# Patient Record
Sex: Female | Born: 1945 | Race: Black or African American | Hispanic: No | Marital: Married | Smoking: Never smoker
Health system: Southern US, Community
[De-identification: ages and names within clinical notes are randomized; demographics above are authoritative.]

## PROBLEM LIST (undated history)

## (undated) DIAGNOSIS — E785 Hyperlipidemia, unspecified: Secondary | ICD-10-CM

## (undated) DIAGNOSIS — I10 Essential (primary) hypertension: Secondary | ICD-10-CM

## (undated) HISTORY — PX: TUBAL LIGATION: SHX77

## (undated) HISTORY — PX: ABDOMINAL HYSTERECTOMY: SHX81

---

## 2003-06-08 ENCOUNTER — Other Ambulatory Visit: Payer: Self-pay

## 2014-03-16 DIAGNOSIS — I10 Essential (primary) hypertension: Secondary | ICD-10-CM | POA: Insufficient documentation

## 2014-03-16 DIAGNOSIS — D129 Benign neoplasm of anus and anal canal: Secondary | ICD-10-CM | POA: Insufficient documentation

## 2014-03-16 DIAGNOSIS — D128 Benign neoplasm of rectum: Secondary | ICD-10-CM | POA: Insufficient documentation

## 2014-04-05 DIAGNOSIS — E785 Hyperlipidemia, unspecified: Secondary | ICD-10-CM | POA: Insufficient documentation

## 2014-04-05 DIAGNOSIS — D1803 Hemangioma of intra-abdominal structures: Secondary | ICD-10-CM | POA: Insufficient documentation

## 2014-04-16 DIAGNOSIS — M8580 Other specified disorders of bone density and structure, unspecified site: Secondary | ICD-10-CM | POA: Insufficient documentation

## 2015-03-14 DIAGNOSIS — E663 Overweight: Secondary | ICD-10-CM | POA: Insufficient documentation

## 2015-04-04 DIAGNOSIS — M170 Bilateral primary osteoarthritis of knee: Secondary | ICD-10-CM | POA: Insufficient documentation

## 2015-09-12 DIAGNOSIS — R7303 Prediabetes: Secondary | ICD-10-CM | POA: Insufficient documentation

## 2016-02-22 ENCOUNTER — Emergency Department: Payer: Medicare PPO

## 2016-02-22 ENCOUNTER — Emergency Department
Admission: EM | Admit: 2016-02-22 | Discharge: 2016-02-22 | Disposition: A | Discharge status: Home / Self Care | Payer: Medicare PPO | Attending: Emergency Medicine | Admitting: Emergency Medicine

## 2016-02-22 DIAGNOSIS — S01511A Laceration without foreign body of lip, initial encounter: Secondary | ICD-10-CM | POA: Diagnosis not present

## 2016-02-22 DIAGNOSIS — W01198A Fall on same level from slipping, tripping and stumbling with subsequent striking against other object, initial encounter: Secondary | ICD-10-CM | POA: Insufficient documentation

## 2016-02-22 DIAGNOSIS — Y929 Unspecified place or not applicable: Secondary | ICD-10-CM | POA: Diagnosis not present

## 2016-02-22 DIAGNOSIS — S0181XA Laceration without foreign body of other part of head, initial encounter: Secondary | ICD-10-CM | POA: Diagnosis not present

## 2016-02-22 DIAGNOSIS — S8002XA Contusion of left knee, initial encounter: Secondary | ICD-10-CM | POA: Diagnosis not present

## 2016-02-22 DIAGNOSIS — T148XXA Other injury of unspecified body region, initial encounter: Secondary | ICD-10-CM

## 2016-02-22 DIAGNOSIS — Y939 Activity, unspecified: Secondary | ICD-10-CM | POA: Insufficient documentation

## 2016-02-22 DIAGNOSIS — S0990XA Unspecified injury of head, initial encounter: Secondary | ICD-10-CM | POA: Diagnosis present

## 2016-02-22 DIAGNOSIS — I10 Essential (primary) hypertension: Secondary | ICD-10-CM | POA: Diagnosis not present

## 2016-02-22 DIAGNOSIS — Y999 Unspecified external cause status: Secondary | ICD-10-CM | POA: Diagnosis not present

## 2016-02-22 HISTORY — DX: Hyperlipidemia, unspecified: E78.5

## 2016-02-22 HISTORY — DX: Essential (primary) hypertension: I10

## 2016-02-22 MED ORDER — LIDOCAINE-EPINEPHRINE (PF) 1 %-1:200000 IJ SOLN
INTRAMUSCULAR | Status: AC
  Filled 2016-02-22: qty 30

## 2016-02-22 NOTE — ED Triage Notes (Signed)
Pt states she tripped and fell on concrete.. Pt has a laceration to the forehead with controlled bleeding at present, an abrasion to the upper lip .Marland Kitchen Denies LOC.. Per pt and family, pt is at baseline, a/ox4.Marland Kitchen Pt also c/o left knee pain.

## 2016-02-22 NOTE — ED Provider Notes (Signed)
[facility] Emergency Department Provider Note  ____________________________________________   First MD Initiated Contact with Patient 02/22/16 1622     (approximate)  I have reviewed the triage vital signs and the nursing notes.   HISTORY  Chief Complaint Fall   HPI Victoria Dodson is a 71 y.o. female with a history of hypertension and hyperlipidemia who is presenting after trip and fall. She misjudged the last step and a staircase and fell forward onto cement hitting the front of her face. She denies losing consciousness but did sustain a laceration to the middle of her forehead. She also sustained an abrasion to the right side of her philtrum as well as to the lower lip. She says that her last tetanus shot was about a year ago. Denying any severe headache at this time. Denying any neck pain.   Past Medical History:  Diagnosis Date  . Hyperlipemia   . Hypertension     There are no active problems to display for this patient.   Past Surgical History:  Procedure Laterality Date  . ABDOMINAL HYSTERECTOMY    . TUBAL LIGATION      Prior to Admission medications   Not on File    Allergies Review of patient's allergies indicates no known allergies.  No family history on file.  Social History Social History  Substance Use Topics  . Smoking status: Never Smoker  . Smokeless tobacco: Never Used  . Alcohol use No    Review of Systems Constitutional: No fever/chills Eyes: No visual changes. ENT: No sore throat. Cardiovascular: Denies chest pain. Respiratory: Denies shortness of breath. Gastrointestinal: No abdominal pain.  No nausea, no vomiting.  No diarrhea.  No constipation. Genitourinary: Negative for dysuria. Musculoskeletal: Negative for back pain. Skin: Negative for rash. Neurological: Negative for headaches, focal weakness or numbness.  10-point ROS otherwise  negative.  ____________________________________________   PHYSICAL EXAM:  VITAL SIGNS: ED Triage Vitals  Enc Vitals Group     BP 02/22/16 1332 (!) 153/86     Pulse Rate 02/22/16 1332 89     Resp 02/22/16 1332 18     Temp 02/22/16 1332 98.2 F (36.8 C)     Temp Source 02/22/16 1332 Oral     SpO2 02/22/16 1332 100 %     Weight 02/22/16 1332 170 lb (77.1 kg)     Height 02/22/16 1332 5\' 7"  (1.702 m)     Head Circumference --      Peak Flow --      Pain Score 02/22/16 1333 8     Pain Loc --      Pain Edu? --      Excl. in [place]? --     Constitutional: Alert and oriented. Well appearing and in no acute distress. Eyes: Conjunctivae are normal. PERRL. EOMI. Head: Laceration in an upside down U-shaped to the midline forehead between the eyebrows. It is through the subcutaneous tissues but does not violate the galea. There is no active bleeding. There is no pus, no induration. Mild tenderness without bogginess. Nose: No congestion/rhinnorhea. Mouth/Throat: Mucous membranes are moist.  Neck: No stridor.  No tenderness to the midline cervical spine. No deformity or step-off. Cardiovascular: Normal rate, regular rhythm. Grossly normal heart sounds.  Good peripheral circulation. Respiratory: Normal respiratory effort.  No retractions. Lungs CTAB. Gastrointestinal: Soft and nontender. No distention.  Musculoskeletal: No lower extremity tenderness nor edema.  No joint effusions. Mild tenderness to the left knee anteriorly but without any effusion. No  deformity. No step-offs. No ligamentous laxity. Patient with 5 out of 5 strength to bilateral lower external without any restriction to active range of motion. Neurologic:  Normal speech and language. No gross focal neurologic deficits are appreciated. No gait instability. Skin:  Abrasion to the right side of the filtrum that is 1 x 2 cm and superficial without any active bleeding, induration or pus. There is also a superficial abrasion with a mild  laceration component to the mucosal surface of the lower midline lip that is about a half a centimeter in length. The wound is superficial. Psychiatric: Mood and affect are normal. Speech and behavior are normal.  ____________________________________________   LABS (all labs ordered are listed, but only abnormal results are displayed)  Labs Reviewed - No data to display ____________________________________________  EKG   ____________________________________________  RADIOLOGY  CT Head Wo Contrast (Accession [id number]) (Order [id number])  Imaging  Date: 02/22/2016 Department: [place] Released By: Frederich Balding, RN (auto-released) Authorizing: Harvest Dark, MD  PACS Images   Show images for CT Head Wo Contrast  Study Result   CLINICAL DATA:  Tripped, fell down steps.  Laceration to forehead.  EXAM: CT HEAD WITHOUT CONTRAST  TECHNIQUE: Contiguous axial images were obtained from the base of the skull through the vertex without intravenous contrast.  COMPARISON:  None.  FINDINGS: Brain: No acute intracranial abnormality. Specifically, no hemorrhage, hydrocephalus, mass lesion, acute infarction, or significant intracranial injury.  Vascular: No hyperdense vessel or unexpected calcification.  Skull: No acute calvarial abnormality.  Sinuses/Orbits: Visualized paranasal sinuses and mastoids clear. Orbital soft tissues unremarkable.  Other: None  IMPRESSION: No acute intracranial abnormality.   Electronically Signed   By: Rolm Baptise M.D.   On: 02/22/2016 14:24     ____________________________________________   PROCEDURES  Procedure(s) performed:   Procedures  Critical Care performed:   LACERATION REPAIR Performed by: Doran Stabler Authorized by: Doran Stabler Consent: Verbal consent obtained. Risks and benefits: risks, benefits and alternatives were discussed Consent given  by: patient Patient identity confirmed: provided demographic data Prepped and Draped in normal sterile fashion Wound explored  Laceration Location: Midline forehead between the eyebrows  Laceration Length: 4 cm  No Foreign Bodies seen or palpated  Anesthesia: local infiltration  Local anesthetic: lidocaine 1% with epinephrine  Anesthetic total: 3 ml  Irrigation method: syringe Amount of cleaning: standard  Skin closure: 5-0 nylon   Number of sutures: 2  Technique: Simple interrupted   Patient tolerance: Patient tolerated the procedure well with no immediate complications. Applied tension with the 2 sutures and then used Dermabond to close the rest of the wound. Good cosmesis was achieved and there was no active bleeding. Steri-Strips were applied over the laceration for extra support. ____________________________________________   INITIAL IMPRESSION / ASSESSMENT AND PLAN / ED COURSE  Pertinent labs & imaging results that were available during my care of the patient were reviewed by me and considered in my medical decision making (see chart for details).    Clinical Course  I discussed with the patient as well as her daughter who is the bedside to keep the wound dry over the first 24 hours and that she should report to her primary care doctor to have the wound inspected as well as the sutures removed in 5 days. She is understanding the plan and willing to comply.   ____________________________________________   FINAL CLINICAL IMPRESSION(S) / ED DIAGNOSES  Fall. Abrasion. Laceration.  NEW MEDICATIONS STARTED DURING THIS VISIT:  New Prescriptions   No medications on file     Note:  This document was prepared using Dragon voice recognition software and may include unintentional dictation errors.    Orbie Pyo, MD 02/22/16 [phone]

## 2017-04-08 IMAGING — CT CT HEAD W/O CM
3 series · 16 of 46 positions shown, 19 images · non-contrast
Comparison: None.

CLINICAL DATA: Tripped, fell down steps.  Laceration to forehead.

EXAM:
CT HEAD WITHOUT CONTRAST
TECHNIQUE: Contiguous axial images were obtained from the base of the skull
through the vertex without intravenous contrast.

[Series 2: head wo · axial · 0.39mm/px · z∈[-92,+28]mm · 10 of 29 slices shown, 13 images]
[im 3/29  brain]
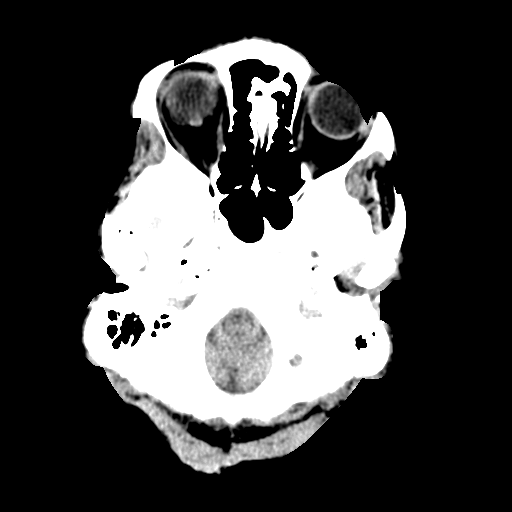
[im 3/29  bone]
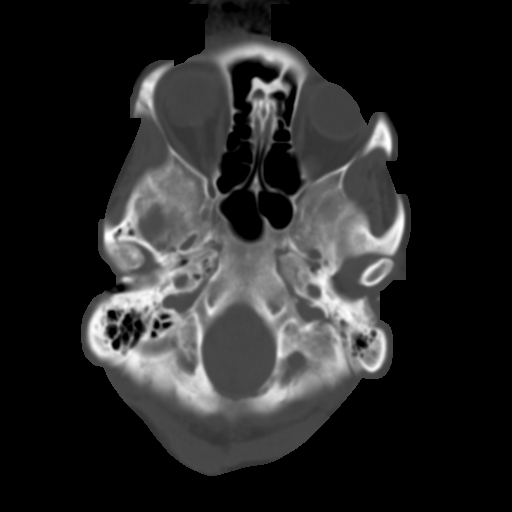
[im 6/29  brain]
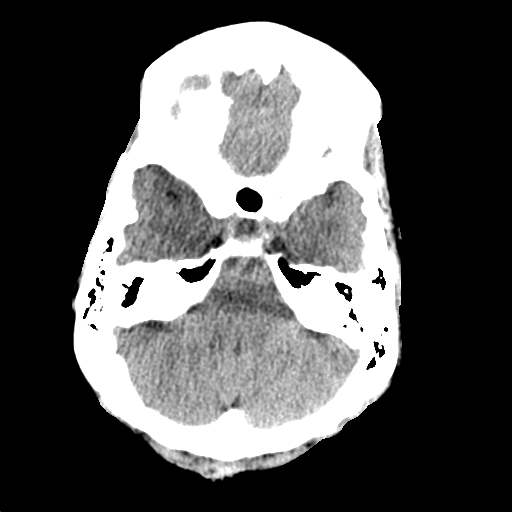
[im 8/29  brain]
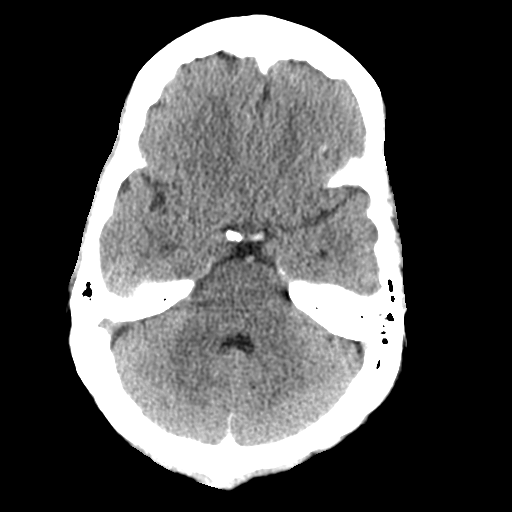
[im 11/29  brain]
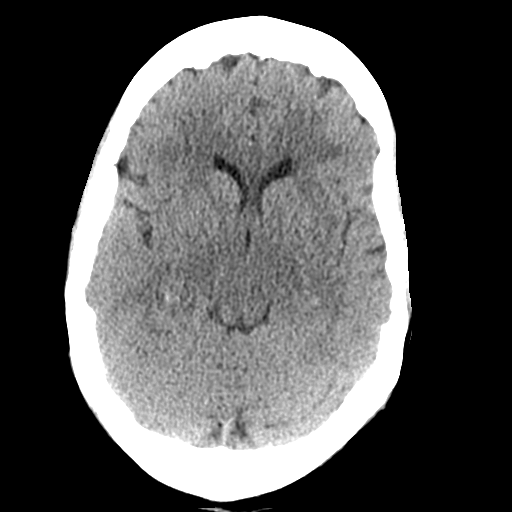
[im 14/29  brain]
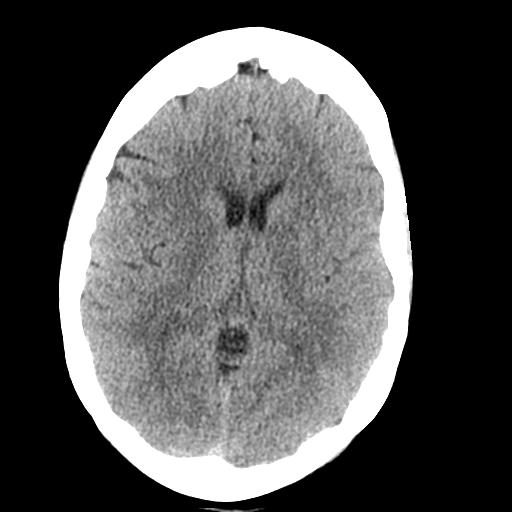
[im 14/29  bone]
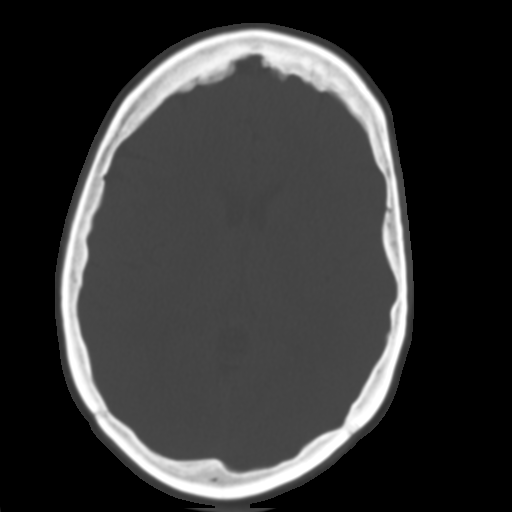
[im 16/29  brain]
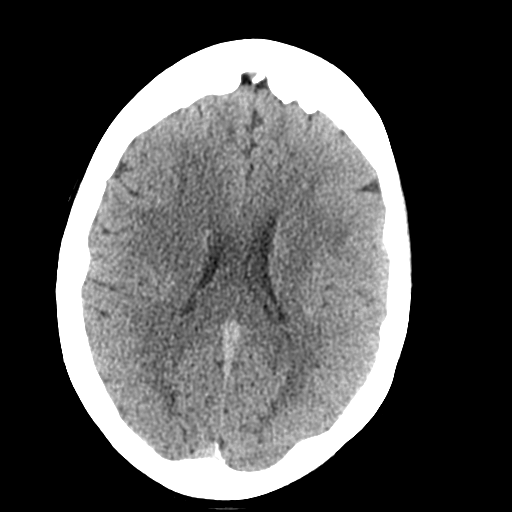
[im 19/29  brain]
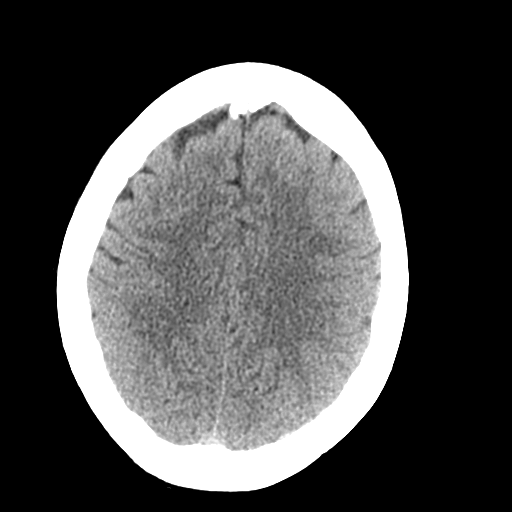
[im 22/29  brain]
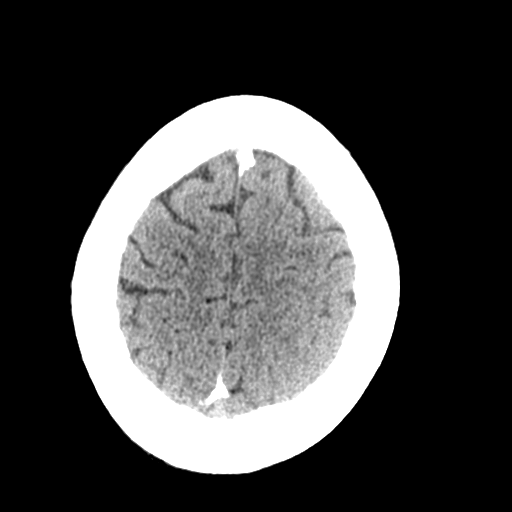
[im 24/29  brain]
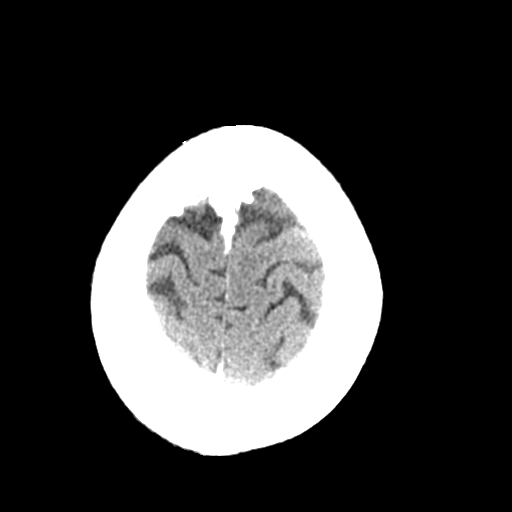
[im 24/29  bone]
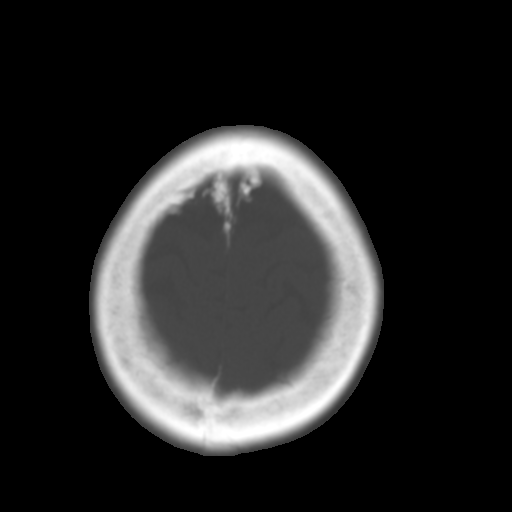
[im 27/29  brain]
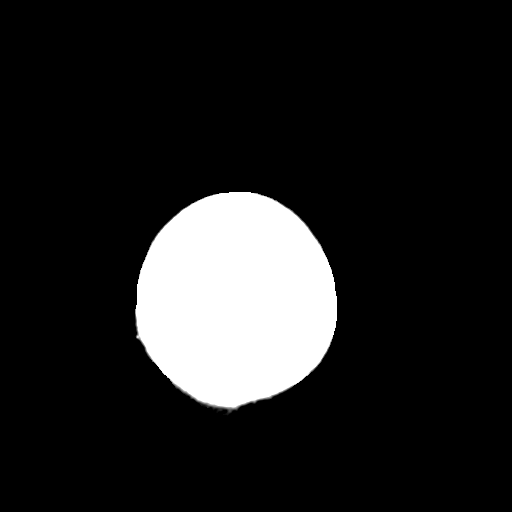

[Series 4: coronal soft tissue · coronal · 0.29mm/px · 3 of 66 slices shown]
[im 22/66  brain]
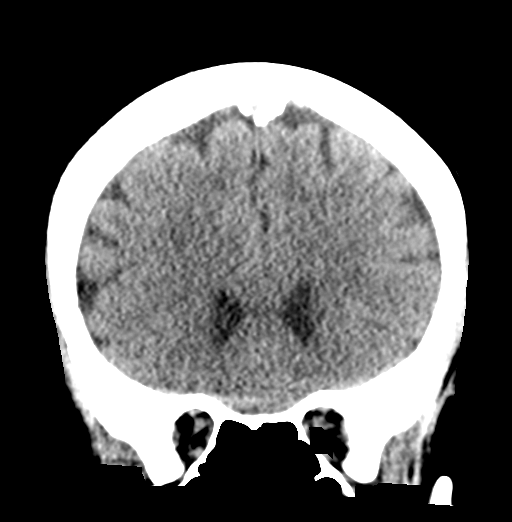
[im 29/66  brain]
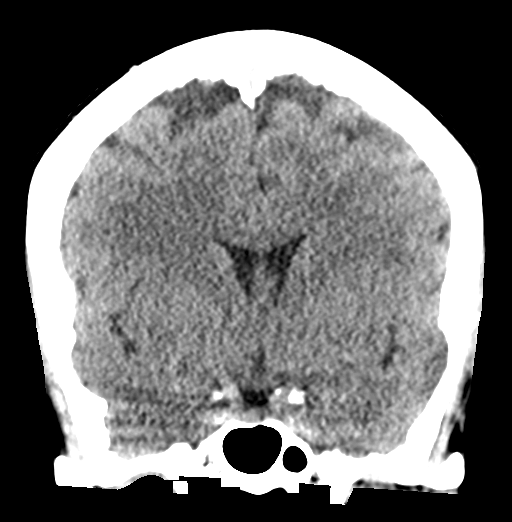
[im 37/66  brain]
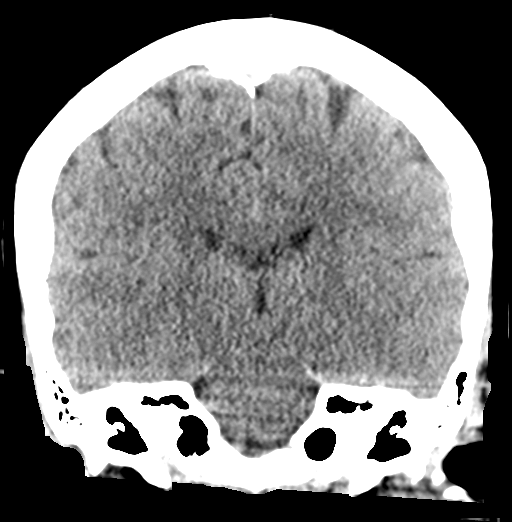

[Series 5: sagittal soft tissue · sagittal · 0.29mm/px · 3 of 50 slices shown]
[im 17/50  brain]
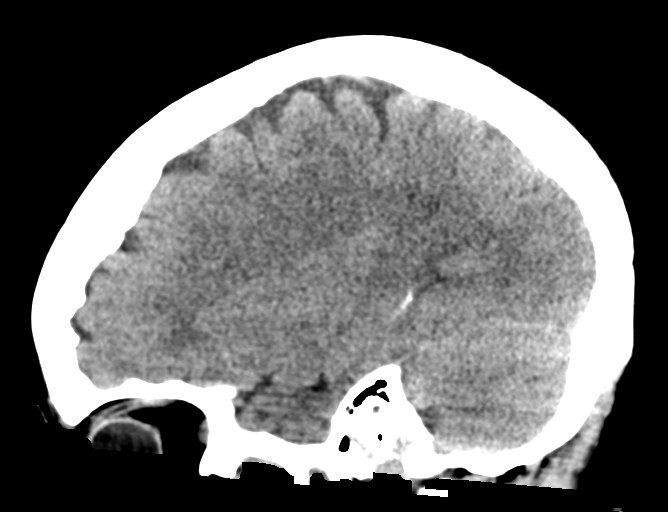
[im 25/50  brain]
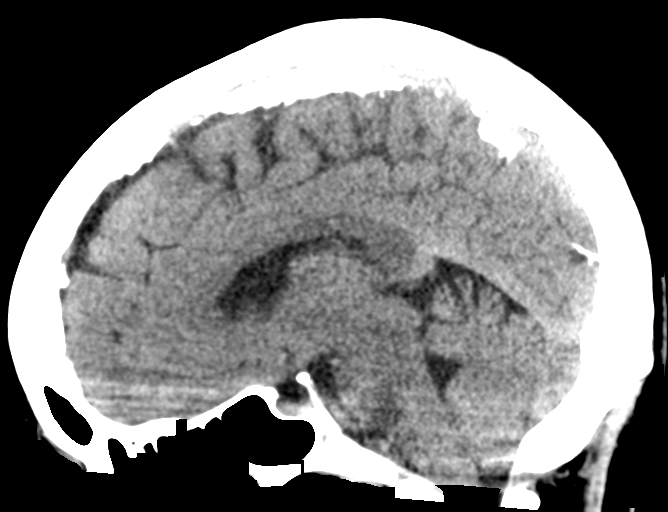
[im 33/50  brain]
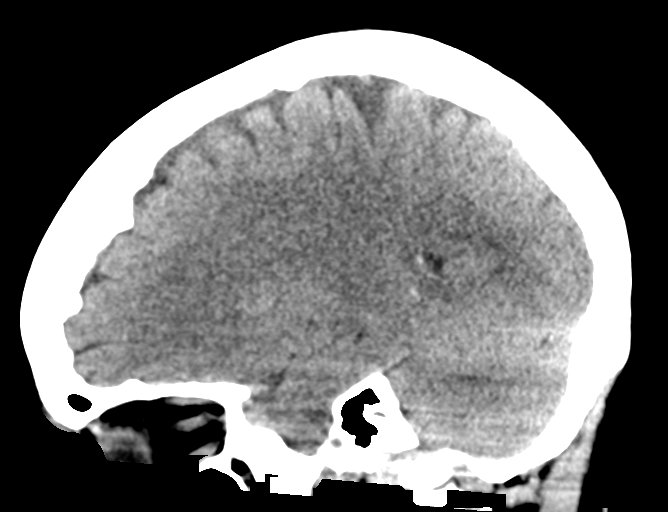

[16 of 46 positions shown; findings below may reference images not displayed]

FINDINGS: Brain: No acute intracranial abnormality. Specifically, no
hemorrhage, hydrocephalus, mass lesion, acute infarction, or
significant intracranial injury.

Vascular: No hyperdense vessel or unexpected calcification.

Skull: No acute calvarial abnormality.

Sinuses/Orbits: Visualized paranasal sinuses and mastoids clear.
Orbital soft tissues unremarkable.

Other: None
IMPRESSION: No acute intracranial abnormality.

## 2020-08-11 DIAGNOSIS — M1712 Unilateral primary osteoarthritis, left knee: Secondary | ICD-10-CM | POA: Insufficient documentation

## 2021-07-12 ENCOUNTER — Encounter: Payer: Self-pay | Admitting: Sports Medicine

## 2021-07-12 ENCOUNTER — Other Ambulatory Visit: Payer: Self-pay

## 2021-07-12 ENCOUNTER — Ambulatory Visit (INDEPENDENT_AMBULATORY_CARE_PROVIDER_SITE_OTHER): Payer: Medicare HMO | Admitting: Sports Medicine

## 2021-07-12 DIAGNOSIS — B351 Tinea unguium: Secondary | ICD-10-CM | POA: Diagnosis not present

## 2021-07-12 DIAGNOSIS — M79674 Pain in right toe(s): Secondary | ICD-10-CM

## 2021-07-12 NOTE — Progress Notes (Signed)
Subjective: Victoria Dodson is a 76 y.o. female patient seen today in office with complaint of thickened and painful right hallux nail states that is been going on for about 2 months gets sore and tender and she soaks denies any known trauma or injury to this toenail but is concerned for fungus.  Patient denies any other pedal complaints at this time.  Patient admits that she may be scheduled for knee surgery in the upcoming months.  There are no problems to display for this patient.   Current Outpatient Medications on File Prior to Visit  Medication Sig Dispense Refill   lisinopril (ZESTRIL) 20 MG tablet Take 1 tablet by mouth daily.     rosuvastatin (CRESTOR) 20 MG tablet Take 1 tablet by mouth daily.     senna-docusate (SENOKOT-S) 8.6-50 MG tablet Take by mouth.     fluticasone (FLONASE) 50 MCG/ACT nasal spray 2 sprays by Each Nare route daily.     Omega-3 Fatty Acids (FISH OIL) 1000 MG CAPS Take by mouth.     No current facility-administered medications on file prior to visit.    No Known Allergies  Objective: Physical Exam  General: Well developed, nourished, no acute distress, awake, alert and oriented x 3  Vascular: Dorsalis pedis artery 2/4 bilateral, Posterior tibial artery 1/4 bilateral, skin temperature warm to warm proximal to distal bilateral lower extremities, no varicosities, pedal hair present bilateral.  Neurological: Gross sensation present via light touch bilateral.   Dermatological: Skin is warm, dry, and supple bilateral, right hallux nail mildly elongated, thick and discolored with moderate subungal debris, partial distal lifting, no webspace macerations present bilateral, no open lesions present bilateral, no callus/corns/hyperkeratotic tissue present bilateral. No signs of infection bilateral.  Musculoskeletal: Bunion boney deformities noted bilateral right greater than left. Muscular strength within normal limits without painon range of motion. No pain with  calf compression bilateral.  Assessment and Plan:  Problem List Items Addressed This Visit   None Visit Diagnoses     Dermatophytosis of nail    -  Primary   Relevant Orders   Fungus Culture with Smear   Toe pain, right           -Examined patient -Discussed treatment options for painful right hallux nail -Fungal culture was obtained by removing a portion of the hard nail itself from right hallux nail using a sterile nail nipper and sent to [facility] lab. Patient tolerated the biopsy procedure well without discomfort or need for anesthesia.  -Recommend foot miracle cream for dry skin as needed to heels. -Patient to return in 4 weeks for follow up evaluation and discussion of fungal culture results or sooner if symptoms worsen.  Landis Martins, DPM

## 2021-08-09 ENCOUNTER — Other Ambulatory Visit: Payer: Self-pay

## 2021-08-09 ENCOUNTER — Ambulatory Visit (INDEPENDENT_AMBULATORY_CARE_PROVIDER_SITE_OTHER): Payer: Medicare HMO | Admitting: Sports Medicine

## 2021-08-09 ENCOUNTER — Encounter: Payer: Self-pay | Admitting: Sports Medicine

## 2021-08-09 DIAGNOSIS — Z79899 Other long term (current) drug therapy: Secondary | ICD-10-CM | POA: Diagnosis not present

## 2021-08-09 DIAGNOSIS — M79674 Pain in right toe(s): Secondary | ICD-10-CM

## 2021-08-09 DIAGNOSIS — B372 Candidiasis of skin and nail: Secondary | ICD-10-CM | POA: Diagnosis not present

## 2021-08-09 NOTE — Progress Notes (Signed)
Subjective: ?Victoria Dodson is a 76 y.o. female patient seen today in office for fungal culture results. Patient has no other pedal complaints at this time.  ? ?Patient Active Problem List  ? Diagnosis Date Noted  ? Primary osteoarthritis of left knee 08/11/2020  ? Pre-diabetes 09/12/2015  ? Primary osteoarthritis of both knees 04/04/2015  ? Over weight 03/14/2015  ? Osteopenia 04/16/2014  ? Hyperlipidemia 04/05/2014  ? Liver hemangioma 04/05/2014  ? 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 Primary hypertension 03/16/2014  ? ? ?Current Outpatient Medications on File Prior to Visit  ?Medication Sig Dispense Refill  ? fluticasone (FLONASE) 50 MCG/ACT nasal spray 2 sprays by Each Nare route daily.    ? lisinopril (ZESTRIL) 20 MG tablet Take 1 tablet by mouth daily.    ? Omega-3 Fatty Acids (FISH OIL) 1000 MG CAPS Take by mouth.    ? rosuvastatin (CRESTOR) 20 MG tablet Take 1 tablet by mouth daily.    ? senna-docusate (SENOKOT-S) 8.6-50 MG tablet Take by mouth.    ? ?No current facility-administered medications on file prior to visit.  ? ? ?No Known Allergies ? ?Objective: ?Physical Exam ? ?General: Well developed, nourished, no acute distress, awake, alert and oriented x 3 ? ?Vascular: Dorsalis pedis artery 2/4 bilateral, Posterior tibial artery 1/4 bilateral, skin temperature warm to warm proximal to distal bilateral lower extremities, no varicosities, pedal hair present bilateral. ?  ?Neurological: Gross sensation present via light touch bilateral.  ?  ?Dermatological: Skin is warm, dry, and supple bilateral, right hallux nail mildly elongated, thick and discolored with moderate subungal debris, partial distal lifting, no webspace macerations present bilateral, no open lesions present bilateral, no callus/corns/hyperkeratotic tissue present bilateral. No signs of infection bilateral. ?  ?Musculoskeletal: Bunion boney deformities noted bilateral right greater than left. Muscular strength within  normal limits without painon range of motion. No pain with calf compression bilateral. ? ? ?Fungal culture + yeast with splitting of nail ? ?Assessment and Plan:  ?Problem List Items Addressed This Visit   ?None ?Visit Diagnoses   ? ? Long term current use of therapeutic drug    -  Primary  ? Relevant Orders  ? Hepatic Function Panel  ? Candida onychomycosis      ? Toe pain, right      ? ?  ? ? ?-Examined patient ?-Discussed treatment options for painful mycotic nails secondary to yeast and nail splitting ?-Patient opt for oral itraconazole with full understanding of medication risks; ordered LFTs for review if within normal limits will proceed with sending Rx to pharmacy if blood work is normal, anticipate 12 week course.  ?-Advised also for patient to use topical Eucerin cream to the toenail after bath or shower as well and has to take over-the-counter biotin supplements ?-Patient to return in approximately 6 weeks assuming that she will be starting the itraconazole sooner problems or issues arise ? ?Landis Martins, DPM ? ?

## 2021-08-10 ENCOUNTER — Other Ambulatory Visit: Payer: Self-pay | Admitting: Sports Medicine

## 2021-08-10 ENCOUNTER — Telehealth: Payer: Self-pay | Admitting: Sports Medicine

## 2021-08-10 DIAGNOSIS — B372 Candidiasis of skin and nail: Secondary | ICD-10-CM

## 2021-08-10 LAB — HEPATIC FUNCTION PANEL
ALT: 10 IU/L (ref 0–32)
AST: 17 IU/L (ref 0–40)
Albumin: 3.8 g/dL (ref 3.7–4.7)
Alkaline Phosphatase: 126 IU/L — ABNORMAL HIGH (ref 44–121)
Bilirubin Total: 0.3 mg/dL (ref 0.0–1.2)
Bilirubin, Direct: 0.11 mg/dL (ref 0.00–0.40)
Total Protein: 6.1 g/dL (ref 6.0–8.5)

## 2021-08-10 MED ORDER — FLUCONAZOLE 150 MG PO TABS: ORAL_TABLET | ORAL | 0 refills | Status: AC

## 2021-08-10 MED ORDER — ITRACONAZOLE 100 MG PO CAPS: 100.0000 mg | ORAL_CAPSULE | Freq: Every day | ORAL | 0 refills | Status: AC

## 2021-08-10 NOTE — Telephone Encounter (Signed)
Pt called stating her insurance doesn't cover her medication and she is unable to purchase it at this time. She would like to know if there's an alternative medication or an OTC instead. Please advise.  ?

## 2021-08-10 NOTE — Progress Notes (Signed)
Changed Itraconazole to Diflucan sine her pharmacy does not cover it ?-Dr. Cannon Kettle ?

## 2021-08-10 NOTE — Progress Notes (Signed)
Sent itraconazole to pharmacy, LFTs near normal ok to start ?-Dr. Cannon Kettle ?

## 2021-08-21 ENCOUNTER — Telehealth: Payer: Self-pay | Admitting: *Deleted

## 2021-08-21 NOTE — Telephone Encounter (Signed)
Called to get status on the prior authorization from insurance company and the patient's plan does not cover prescriptions, spoke with Kirke Corin. ?Called patient and she was aware that she did not have that plan, said that she normally gets her medicine from [place]. She called and they will not cover that particular medicine(Fluconazole). ? She did not like what she read about the side effects of the medicine and would prefer something else. Will continue to soak in epsom salts and using eucerin cream until further instructions. ?

## 2021-09-20 ENCOUNTER — Ambulatory Visit: Payer: Medicare HMO | Admitting: Sports Medicine

## 2021-10-04 ENCOUNTER — Ambulatory Visit: Payer: Medicare HMO | Admitting: Sports Medicine

## 2022-04-16 ENCOUNTER — Ambulatory Visit (INDEPENDENT_AMBULATORY_CARE_PROVIDER_SITE_OTHER): Payer: Medicare HMO | Admitting: Podiatry

## 2022-04-16 DIAGNOSIS — L600 Ingrowing nail: Secondary | ICD-10-CM

## 2022-04-16 DIAGNOSIS — B351 Tinea unguium: Secondary | ICD-10-CM

## 2022-04-16 NOTE — Patient Instructions (Signed)

## 2022-04-16 NOTE — Progress Notes (Signed)
  Subjective:  Patient ID: Victoria Dodson, female    DOB: 1945-12-04,  MRN: [id number]  Chief Complaint  Patient presents with   Nail Problem    Right GREAT TOE NAIL - TURNING COLORS, PAINFUL. Patient was taking Lamisil for nail fungus. Patient has not used topical medication. Patient is not diabetic.     76 y.o. female presents with concern for pain in the right hallux.  She also notices the nail is thickened discolored and growing abnormally.  She has not yet tried any topical medications or taken any oral antifungals.  Patient does not have a history of diabetes.  She denies any history of trauma to this nail.  Past Medical History:  Diagnosis Date   Hyperlipemia    Hypertension     No Known Allergies  ROS: Negative except as per HPI above  Objective:  General: AAO x3, NAD  Dermatological: R hallux nail extremely thickened, black discoloration, dystrophic, pain with palpation total nail.   Vascular:  Dorsalis Pedis artery and Posterior Tibial artery pedal pulses are 2/4 bilateral.  Capillary fill time < 3 sec to all digits.   Neruologic: Grossly intact via light touch bilateral. Protective threshold intact to all sites bilateral.   Musculoskeletal: No gross boney pedal deformities bilateral. No pain, crepitus, or limitation noted with foot and ankle range of motion bilateral. Muscular strength 5/5 in all groups tested bilateral.  Gait: Unassisted, Nonantalgic.   No images are attached to the encounter.   Assessment:   1. Ingrown nail of great toe of right foot   2. Onychomycosis      Plan:  Patient was evaluated and treated and all questions answered.    Ingrown Nail, right hallux due to fungal thickening -Patient elects to proceed with minor surgery to remove ingrown toenail today. Consent reviewed and signed by patient. -Ingrown nail excised. See procedure note. -Educated on post-procedure care including soaking. Written instructions provided and  reviewed. -Patient to follow up in 2 weeks for nail check.  Procedure: Right hallux total avulsion Location: Right 1st toe total nail Anesthesia: Lidocaine 1% plain; 1.5 mL and Marcaine 0.5% plain; 1.5 mL, digital block. Skin Prep: Betadine. Dressing: Silvadene; telfa; dry, sterile, compression dressing. Technique: Following skin prep, the toe was exsanguinated and a tourniquet was secured at the base of the toe. The affected nail border was freed, split with a nail splitter, and excised. Area was irrigated out with alcohol. The tourniquet was then removed and sterile dressing applied. Disposition: Patient tolerated procedure well. Patient to return in 2 weeks for follow-up.    Return in about 2 weeks (around 04/30/2022) for Follow up R hallux total avulsion.          Everitt Amber, DPM [facility]

## 2022-05-07 ENCOUNTER — Ambulatory Visit (INDEPENDENT_AMBULATORY_CARE_PROVIDER_SITE_OTHER): Payer: Medicare HMO | Admitting: Podiatry

## 2022-05-07 DIAGNOSIS — L600 Ingrowing nail: Secondary | ICD-10-CM | POA: Diagnosis not present

## 2022-05-07 DIAGNOSIS — B351 Tinea unguium: Secondary | ICD-10-CM | POA: Diagnosis not present

## 2022-05-07 MED ORDER — CICLOPIROX 8 % EX SOLN: Freq: Every day | CUTANEOUS | 0 refills | Status: DC

## 2022-05-07 NOTE — Progress Notes (Signed)
Subjective: Victoria Dodson is a 76 y.o.  female returns to office today for follow up evaluation after having right Hallux total nail avulsion for ingrown removal 2/2 mycotic thickening approximately 2 weeks ago. Patient has been soaking using epsom salts and applying topical antibiotic covered with bandaid daily. Patient denies fevers, chills, nausea, vomiting. Denies any calf pain, chest pain, SOB.   Objective:  Vitals: Reviewed  General: Well developed, nourished, in no acute distress, alert and oriented x3   Dermatology: Skin is warm, dry and supple bilateral. right hallux nail bed appears to be clean, dry, with mild granular tissue and surrounding scab. There is no surrounding erythema, edema, drainage/purulence. The remaining nails appear unremarkable at this time. There are no other lesions or other signs of infection present.  Neurovascular status: Intact. No lower extremity swelling; No pain with calf compression bilateral.  Musculoskeletal: Decreased tenderness to palpation of the right hallux nail bed. Muscular strength within normal limits bilateral.   Assesement and Plan: S/p total right hallux avulsion to the  right hallux nail , doing well.   -Continue soaking in epsom salts twice a day followed by antibiotic ointment and a band-aid. Can leave uncovered at night. Continue this until completely healed.  -If the area has not healed in 2 weeks, call the office for follow-up appointment, or sooner if any problems arise.  -Monitor for any signs/symptoms of infection. Call the office immediately if any occur or go directly to the emergency room. Call with any questions/concerns. -Recommend use of Penlac 8% solution topically applied to the right hallux nail as it begins to grow back in in 1 to 2 months.  Apply daily at night.  This will hopefully help it from going back with significant mycotic thickening.        Everitt Amber, DPM [facility]                    05/07/2022

## 2022-05-29 ENCOUNTER — Other Ambulatory Visit: Payer: Self-pay

## 2022-05-29 DIAGNOSIS — B351 Tinea unguium: Secondary | ICD-10-CM

## 2022-05-29 MED ORDER — CICLOPIROX 8 % EX SOLN: Freq: Every day | CUTANEOUS | 0 refills | Status: DC

## 2023-02-18 ENCOUNTER — Ambulatory Visit (INDEPENDENT_AMBULATORY_CARE_PROVIDER_SITE_OTHER): Payer: Medicare Other | Admitting: Podiatry

## 2023-02-18 DIAGNOSIS — B351 Tinea unguium: Secondary | ICD-10-CM

## 2023-02-18 NOTE — Progress Notes (Signed)
  Subjective:  Patient ID: Victoria Dodson, female    DOB: 22-Sep-1945,  MRN: [id number]  Chief Complaint  Patient presents with   Nail Problem    Nail fungus to right hallux. Patient is not using the Penlac as ordered. She does have the medication at home. Right hallux nail was removed 04/16/2022.     77 y.o. female presents for follow-up on right hallux nail fungal infection.  Previously had the right hallux nail removed in April 16, 2022.  Nail has been growing back as previously planned.  She was using some of the Penlac but not doing it on a daily basis.  Denies any pain in the right hallux nail dystroph  Past Medical History:  Diagnosis Date   Hyperlipemia    Hypertension     No Known Allergies  ROS: Negative except as per HPI above  Objective:  General: AAO x3, NAD  Dermatological: Dystrophy noted of the right hallux nail with longitudinal ridging mild whitish discoloration but no significant thickening or pain on palpation  Vascular:  Dorsalis Pedis artery and Posterior Tibial artery pedal pulses are 2/4 bilateral.  Capillary fill time < 3 sec to all digits.   Neruologic: Grossly intact via light touch bilateral. Protective threshold intact to all sites bilateral.   Musculoskeletal: No gross boney pedal deformities bilateral. No pain, crepitus, or limitation noted with foot and ankle range of motion bilateral. Muscular strength 5/5 in all groups tested bilateral.  Gait: Unassisted, Nonantalgic.   No images are attached to the encounter.  Assessment:   1. Onychomycosis   2. Dermatophytosis of nail      Plan:  Patient was evaluated and treated and all questions answered.  # Mild nail fungal infection onychomycosis of the right hallux nail improved after prior total nail avulsion -Recommend continued use of Penlac topical solution apply once daily to the right hallux nail for the next 3 months -Overall the nail is improved from prior and does not appear as thick  or dystrophic as it was before the prior removal -Will continue to monitor with topical antifungal therapy  Return in about 3 months (around 05/20/2023) for f/u R hallux onychomycosis.          Corinna Gab, DPM [facility]

## 2023-05-20 ENCOUNTER — Ambulatory Visit: Payer: Medicare Other | Admitting: Podiatry

## 2023-06-25 ENCOUNTER — Encounter: Payer: Self-pay | Admitting: Podiatry

## 2023-06-25 ENCOUNTER — Ambulatory Visit (INDEPENDENT_AMBULATORY_CARE_PROVIDER_SITE_OTHER): Payer: Medicare Other | Admitting: Podiatry

## 2023-06-25 DIAGNOSIS — B351 Tinea unguium: Secondary | ICD-10-CM

## 2023-06-25 MED ORDER — CICLOPIROX 8 % EX SOLN: Freq: Every day | CUTANEOUS | 3 refills | Status: AC

## 2023-06-25 NOTE — Progress Notes (Signed)
  Subjective:  Patient ID: Victoria Dodson, female    DOB: 15-Dec-1945,  MRN: [id number]  Chief Complaint  Patient presents with   Nail Problem    Rt great nail, fungus, pt states she thinks she should just take it off.     78 y.o. female presents for follow-up on right hallux nail fungal infection.  Previously had the right hallux nail removed in April 16, 2022.  She has been continuing with the penlac . She is unsure of her progress and would like to discuss treatment options.  Past Medical History:  Diagnosis Date   Hyperlipemia    Hypertension     No Known Allergies  ROS: Negative except as per HPI above  Objective:  General: AAO x3, NAD  Dermatological: Dystrophy noted of the right hallux nail with longitudinal ridging mild whitish discoloration some thickening to distal aspect.  There does appear to be proximal clear nail growth approximately the proximal one third with similar pigmentation to the unaffected nails.  Vascular:  Dorsalis Pedis artery and Posterior Tibial artery pedal pulses are 2/4 bilateral.  Capillary fill time < 3 sec to all digits.   Neruologic: Grossly intact via light touch bilateral. Protective threshold intact to all sites bilateral.   Musculoskeletal: No gross boney pedal deformities bilateral. No pain, crepitus, or limitation noted with foot and ankle range of motion bilateral. Muscular strength 5/5 in all groups tested bilateral.  Gait: Unassisted, Nonantalgic.   No images are attached to the encounter.  Assessment:   1. Onychomycosis   2. Dermatophytosis of nail      Plan:  Patient was evaluated and treated and all questions answered.  # Mild nail fungal infection onychomycosis of the right hallux nail, some improvement with 5 months of penlac  -Previously had total nail avulsion. -Recommend continued use of Penlac  topical solution apply once daily to the right hallux nail for the next 3 months -Overall the nail is improved from prior  and does not appear as thick or dystrophic as previously and there is evidence of clear proximal nail growth -Did discuss that we could try repeat nail avulsion with shorter dose of oral antifungal medication but that there be no guarantee of normal appearance of nail with regrowth.  Patient understands this -Will continue to monitor with topical antifungal therapy for now  Return in about 3 months (around 09/23/2023) for Onychomycosis.          Ethan Saddler, DPM [facility] / [facility]

## 2023-07-04 ENCOUNTER — Telehealth: Payer: Self-pay

## 2023-07-04 NOTE — Telephone Encounter (Signed)
Patient called  and left a message - she has a lump or bump on the bottom of her foot - Did you notice anything at her visit? She is asking if there is something OTC she can use?

## 2023-07-09 ENCOUNTER — Telehealth: Payer: Self-pay | Admitting: Podiatry

## 2023-07-09 NOTE — Telephone Encounter (Signed)
Patient called back complaining about the knot on the bottom of her foot. Would like to know what she should do

## 2023-07-16 ENCOUNTER — Ambulatory Visit (INDEPENDENT_AMBULATORY_CARE_PROVIDER_SITE_OTHER): Payer: Medicare Other

## 2023-07-16 ENCOUNTER — Ambulatory Visit (INDEPENDENT_AMBULATORY_CARE_PROVIDER_SITE_OTHER): Payer: Medicare Other | Admitting: Podiatry

## 2023-07-16 DIAGNOSIS — M79671 Pain in right foot: Secondary | ICD-10-CM | POA: Diagnosis not present

## 2023-07-16 DIAGNOSIS — L84 Corns and callosities: Secondary | ICD-10-CM

## 2023-07-16 DIAGNOSIS — M778 Other enthesopathies, not elsewhere classified: Secondary | ICD-10-CM

## 2023-07-16 NOTE — Progress Notes (Signed)
       Chief Complaint  Patient presents with   Foot Pain    Right Heel pain, Pain is in the caneter if the heel. Apears to be a callous. Xrays are up. This started 06/26/23 the day after she was here last. She has been soaking daily in epsom salts which does not seem to be helping.  Not diabetic and no anticoags.     HPI: 78 y.o. female presents today with complaint of pain to right heel.  States that she noticed a new hard spot after her last visit several weeks ago.  She does not have history of calluses that she is aware of.  States that she is prediabetic.  Past Medical History:  Diagnosis Date   Hyperlipemia    Hypertension     Past Surgical History:  Procedure Laterality Date   ABDOMINAL HYSTERECTOMY     TUBAL LIGATION      No Known Allergies  ROS    Physical Exam: There were no vitals filed for this visit.  General: The patient is alert and oriented x3 in no acute distress.  Dermatology: Hyperkeratotic lesion present plantar heel central aspect.  No surrounding erythema.  No underlying ulcer.  No evidence of foreign body.  Vascular: Palpable pedal pulses bilaterally. Capillary refill within normal limits.  No appreciable edema.  No erythema or calor.  Neurological: Light touch sensation grossly intact bilateral feet.   Musculoskeletal Exam: No gross boney pedal deformities bilateral. No pain, crepitus, or limitation noted with foot and ankle range of motion bilateral. Muscular strength 5/5 in all groups tested bilateral.   Radiographic Exam: Right foot 07/16/2023 Normal osseous mineralization. Joint spaces preserved.  No fractures or osseous irregularities noted.  Assessment/Plan of Care: 1. Right foot pain   2. Callus of heel      No orders of the defined types were placed in this encounter.  None  Discussed clinical findings with patient today.  Radiographs reviewed with the patient.  # Painful callus of right heel All symptomatic hyperkeratoses x1 were  safely debrided as a courtesy today with a sterile #312 blade to patient's level of comfort without incident. We discussed preventative and palliative care of these lesions including supportive and accommodative shoegear, padding, prefabricated and custom molded accommodative orthoses, use of a pumice stone and lotions/creams daily.  Follow-up as needed for this issue.  Does have scheduled follow-up to assess onychomycosis.   Hedy Garro L. Lamount MAUL, AACFAS [facility]     [address] [place], [place] [address]                Office [phone]  Fax [phone]

## 2023-07-16 NOTE — Patient Instructions (Signed)
 Look for urea 40% cream or ointment and apply to the thickened dry skin / calluses. This can be bought over the counter, at a pharmacy or online such as [employer].  You can also look for combination product with salicylic acid   I also recommend scrubbing the callus with white vinegar prior to showering.  This will make it easier to file the callus down with an emery board or pumice stone.  Applying felt padding or callus pads around this area may also be helpful for you.

## 2023-07-17 ENCOUNTER — Encounter: Payer: Self-pay | Admitting: Podiatry

## 2023-09-24 ENCOUNTER — Ambulatory Visit: Payer: Medicare Other | Admitting: Podiatry
